# Patient Record
Sex: Male | Born: 1975 | Race: Black or African American | Hispanic: No | Marital: Single | Smoking: Never smoker
Health system: Southern US, Community
[De-identification: ages and names within clinical notes are randomized; demographics above are authoritative.]

---

## 2016-07-21 ENCOUNTER — Emergency Department (HOSPITAL_COMMUNITY)

## 2016-07-21 ENCOUNTER — Encounter (HOSPITAL_COMMUNITY): Payer: Self-pay

## 2016-07-21 DIAGNOSIS — Y9289 Other specified places as the place of occurrence of the external cause: Secondary | ICD-10-CM | POA: Insufficient documentation

## 2016-07-21 DIAGNOSIS — Y999 Unspecified external cause status: Secondary | ICD-10-CM | POA: Insufficient documentation

## 2016-07-21 DIAGNOSIS — S93115A Dislocation of interphalangeal joint of left lesser toe(s), initial encounter: Secondary | ICD-10-CM | POA: Insufficient documentation

## 2016-07-21 DIAGNOSIS — S99922A Unspecified injury of left foot, initial encounter: Secondary | ICD-10-CM | POA: Diagnosis present

## 2016-07-21 DIAGNOSIS — W2209XA Striking against other stationary object, initial encounter: Secondary | ICD-10-CM | POA: Diagnosis not present

## 2016-07-21 DIAGNOSIS — Y939 Activity, unspecified: Secondary | ICD-10-CM | POA: Insufficient documentation

## 2016-07-21 NOTE — ED Triage Notes (Signed)
Pt is in custody of [place] [employer], states he accidentally kicked a door with his left foot, mainly his left pinky toe that is deformed at this time.

## 2016-07-22 ENCOUNTER — Emergency Department (HOSPITAL_COMMUNITY)

## 2016-07-22 ENCOUNTER — Emergency Department (HOSPITAL_COMMUNITY)
Admission: EM | Admit: 2016-07-22 | Discharge: 2016-07-22 | Disposition: A | Discharge status: Home / Self Care | Attending: Emergency Medicine | Admitting: Emergency Medicine

## 2016-07-22 DIAGNOSIS — S93105A Unspecified dislocation of left toe(s), initial encounter: Secondary | ICD-10-CM

## 2016-07-22 MED ORDER — LIDOCAINE HCL (PF) 1 % IJ SOLN
5.0000 mL | Freq: Once | INTRAMUSCULAR | Status: DC
  Filled 2016-07-22: qty 5

## 2016-07-22 MED ORDER — HYDROCODONE-ACETAMINOPHEN 5-325 MG PO TABS
1.0000 | ORAL_TABLET | Freq: Once | ORAL | Status: AC
  Administered 2016-07-22: 1 via ORAL
  Filled 2016-07-22: qty 1

## 2016-07-22 NOTE — ED Provider Notes (Signed)
[facility]-EM[facility]Y DEPT Provider Note   CSN: [id number] Arrival date & time: 07/21/16  2329     History   Chief Complaint Chief Complaint  Patient presents with  . Toe Injury    HPI Miguel Roberts is a 40 y.o. male.  The history is provided by the patient.  Toe Pain  This is a new problem. The current episode started 3 to 5 hours ago. The problem occurs constantly. The problem has been gradually worsening. The symptoms are aggravated by walking. The symptoms are relieved by rest.  pt is currently in police custody He accidentally kicked a door and injured his left little toe No other injury reported    PMH - none  Home Medications    Prior to Admission medications   Not on File    Family History No family history on file.  Social History Social History  Substance Use Topics  . Smoking status: Never Smoker  . Smokeless tobacco: Never Used  . Alcohol use No     Allergies   Patient has no known allergies.   Review of Systems Review of Systems  Constitutional: Negative for fever.  Musculoskeletal: Positive for arthralgias.     Physical Exam Updated Vital Signs BP 128/82 (BP Location: Right Arm)   Pulse 71   Temp 98.1 F (36.7 C) (Oral)   Resp 20   Ht 6' (1.829 m)   Wt 92.1 kg   SpO2 100%   BMI 27.53 kg/m   Physical Exam CONSTITUTIONAL: Well developed/well nourished HEAD: Normocephalic/atraumatic EYES: EOMI ENMT: Mucous membranes moist NECK: supple no meningeal signs NEURO: Pt is awake/alert/appropriate, moves all extremitiesx4.  No facial droop.   EXTREMITIES: pulses normal/equal, full ROM, tenderness/deformity to left little toe, no discoloration, no lacerations, no other signs of trauma to left foot SKIN: warm, color normal PSYCH: no abnormalities of mood noted, alert and oriented to situation   ED Treatments / Results  Labs (all labs ordered are listed, but only abnormal results are displayed) Labs Reviewed - No data to  display  EKG  EKG Interpretation None       Radiology Dg Toe 5th Left  Result Date: 07/22/2016 CLINICAL DATA:  Postreduction left fifth toe EXAM: DG TOE 5TH LEFT COMPARISON:  07/21/2016 FINDINGS: Anatomic alignment of the left fifth toe is demonstrated postreduction. No acute fractures are identified. IMPRESSION: Anatomic alignment of the left fifth toe demonstrated postreduction. Electronically Signed   By: Burman NievesWilliam  Stevens M.D.   On: 07/22/2016 04:20   Dg Toe 5th Left  Result Date: 07/21/2016 CLINICAL DATA:  Injury to the left fifth toe tonight with subsequent pain. EXAM: DG TOE 5TH LEFT COMPARISON:  None. FINDINGS: Dorsal and lateral dislocation of the proximal interphalangeal joint of the left fifth toe. Soft tissue swelling. No acute fractures identified. An accessory ossicle is present. IMPRESSION: Dislocation of the proximal interphalangeal joint of the left fifth toe. Electronically Signed   By: Burman NievesWilliam  Stevens M.D.   On: 07/21/2016 23:57    Procedures Reduction of dislocation Date/Time: 07/22/2016 2:00 AM Performed by: Zadie RhineWICKLINE, Evertte Sones Authorized by: Zadie RhineWICKLINE, Crissie Aloi  Consent: Verbal consent obtained. Patient understanding: patient states understanding of the procedure being performed Patient identity confirmed: verbally with patient Patient tolerance: Patient tolerated the procedure well with no immediate complications Comments: Patient with left little toe dislocation Multiple attempts to reduce toe were unsuccessful  .Nerve Block Date/Time: 07/22/2016 2:00 AM Performed by: Zadie RhineWICKLINE, Maikol Grassia Authorized by: Zadie RhineWICKLINE, Jezabel Lecker   Consent:    Consent obtained:  Verbal Indications:    Indications:  Pain relief and procedural anesthesia Location:    Body area:  Lower extremity   Laterality:  Left (left little toe) Pre-procedure details:    Skin preparation:  Povidone-iodine   Preparation: Patient was prepped and draped in usual sterile fashion   Procedure details  (see MAR for exact dosages):    Block needle gauge:  25 G   Anesthetic injected:  Lidocaine 1% w/o epi   Steroid injected:  None   Injection procedure:  Anatomic landmarks identified   SPLINT APPLICATION Date/Time: 0400 Authorized by: Joya GaskinsWICKLINE,Trip Cavanagh W Consent: Verbal consent obtained. Risks and benefits: risks, benefits and alternatives were discussed Consent given by: patient Splint applied by: nurse Location details: left little toe Splint type: buddy tape Supplies used: tape Post-procedure: The splinted body part was neurovascularly unchanged following the procedure. Patient tolerance: Patient tolerated the procedure well with no immediate complications.     Medications Ordered in ED Medications  lidocaine (PF) (XYLOCAINE) 1 % injection 5 mL (not administered)  HYDROcodone-acetaminophen (NORCO/VICODIN) 5-325 MG per tablet 1 tablet (1 tablet Oral Given 07/22/16 0132)     Initial Impression / Assessment and Plan / ED Course  I have reviewed the triage vital signs and the nursing notes.  Pertinent imaging results that were available during my care of the patient were reviewed by me and considered in my medical decision making (see chart for details).  Clinical Course     Initial attempts to reduce toe were unsuccessful when I attempted While he was waiting, pt pulled on toe himself and was able to reduce toe It is now in normal alignment Repeat xray shows normal alignment   Final Clinical Impressions(s) / ED Diagnoses   Final diagnoses:  Toe dislocation, left, initial encounter    New Prescriptions There are no discharge medications for this patient.    Zadie Rhineonald Catelyn Friel, MD 07/22/16 [phone]

## 2018-06-20 IMAGING — DX DG TOE 5TH 2+V*L*
3 series · 3 of 3 positions shown · non-contrast
Comparison: None.

CLINICAL DATA: Injury to the left fifth toe tonight with subsequent
pain.

EXAM:
DG TOE 5TH LEFT

[toe ap]
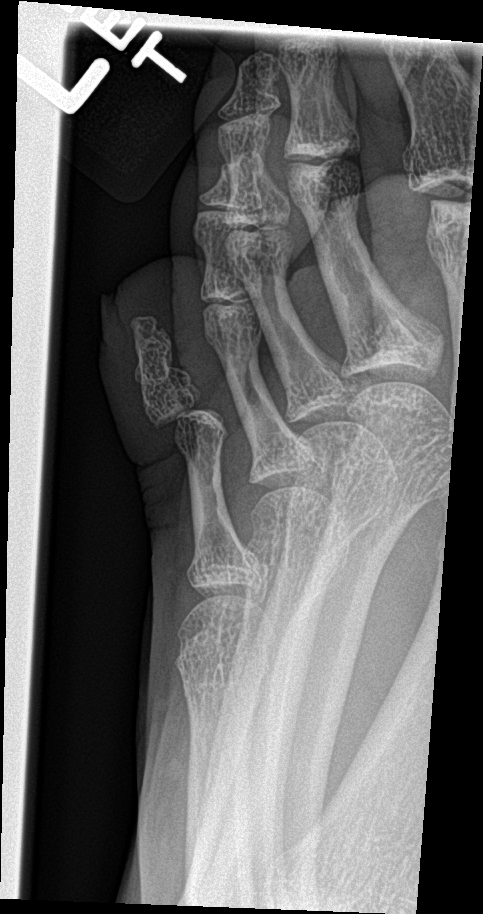

[toe obl]
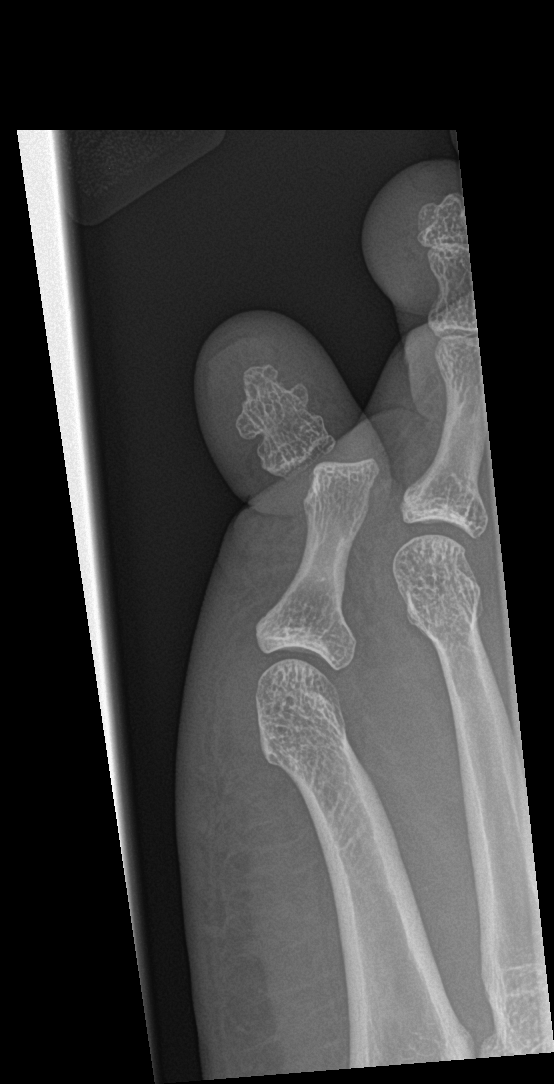

[toe lat]
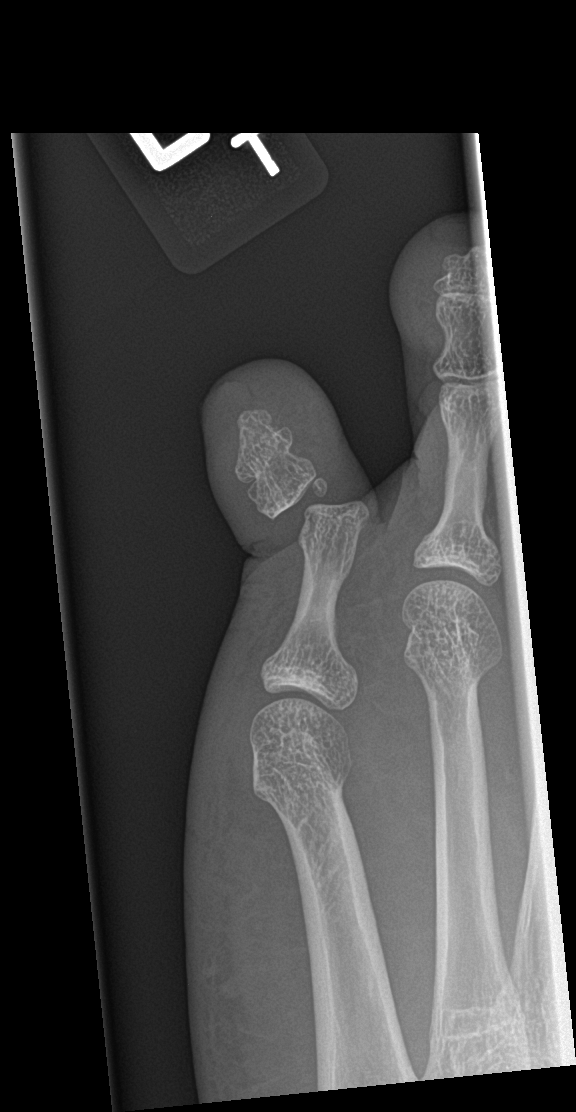

[3 of 3 positions shown; findings below may reference images not displayed]

FINDINGS: Dorsal and lateral dislocation of the proximal interphalangeal joint
of the left fifth toe. Soft tissue swelling. No acute fractures
identified. An accessory ossicle is present.
IMPRESSION: Dislocation of the proximal interphalangeal joint of the left fifth
toe.
